# Patient Record
Sex: Male | Born: 1970 | Hispanic: Yes | Marital: Single | Smoking: Never smoker
Health system: Southern US, Community
[De-identification: ages and names within clinical notes are randomized; demographics above are authoritative.]

---

## 2011-11-16 ENCOUNTER — Other Ambulatory Visit: Payer: Self-pay | Admitting: Geriatric Medicine

## 2011-11-16 ENCOUNTER — Ambulatory Visit
Admission: RE | Admit: 2011-11-16 | Discharge: 2011-11-16 | Disposition: A | Discharge status: Home / Self Care | Payer: No Typology Code available for payment source | Source: Ambulatory Visit | Attending: Geriatric Medicine | Admitting: Geriatric Medicine

## 2011-11-16 DIAGNOSIS — D499 Neoplasm of unspecified behavior of unspecified site: Secondary | ICD-10-CM

## 2011-11-16 DIAGNOSIS — Q899 Congenital malformation, unspecified: Secondary | ICD-10-CM

## 2011-11-22 ENCOUNTER — Other Ambulatory Visit: Payer: Self-pay

## 2011-12-10 ENCOUNTER — Ambulatory Visit (INDEPENDENT_AMBULATORY_CARE_PROVIDER_SITE_OTHER): Payer: Self-pay | Admitting: General Surgery

## 2011-12-14 ENCOUNTER — Encounter (INDEPENDENT_AMBULATORY_CARE_PROVIDER_SITE_OTHER): Payer: Self-pay | Admitting: General Surgery

## 2011-12-14 ENCOUNTER — Ambulatory Visit (INDEPENDENT_AMBULATORY_CARE_PROVIDER_SITE_OTHER): Payer: Self-pay | Admitting: General Surgery

## 2011-12-14 VITALS — BP 140/92 | HR 72 | Temp 99.4°F | Resp 20 | Ht 61.0 in | Wt 140.4 lb

## 2011-12-14 DIAGNOSIS — K429 Umbilical hernia without obstruction or gangrene: Secondary | ICD-10-CM

## 2011-12-14 NOTE — Progress Notes (Signed)
Chief Complaint  Patient presents with  . Pre-op Exam    eval UMB hernia    HISTORY: This is a 41yo M who has noticed an umbilical mass for about 3 yrs now.  It does not cause him pain and has not gotten bigger.  He denies any signs of bowel incarceration either.  He does have a job that involves quite a bit of lifting, and he would like to be evaluated for surgery.    History reviewed. No pertinent past medical history.  History reviewed. No pertinent past surgical history.  No current outpatient prescriptions on file.  No Known Allergies   Family History  Problem Relation Age of Onset  . Cancer Father     prostate    History   Social History  . Marital Status: Single    Spouse Name: N/A    Number of Children: N/A  . Years of Education: N/A   Social History Main Topics  . Smoking status: Never Smoker   . Smokeless tobacco: Never Used  . Alcohol Use: No  . Drug Use: No  . Sexually Active: None   Other Topics Concern  . None   Social History Narrative  . None     REVIEW OF SYSTEMS - PERTINENT POSITIVES ONLY: 12 point review of systems negative other than HPI and PMH  EXAM: Filed Vitals:   12/14/11 1110  BP: 140/92  Pulse: 72  Temp: 99.4 F (37.4 C)  Resp: 20    Gen:  No acute distress.  Well nourished and well groomed.   Neurological: Alert and oriented to person, place, and time. Coordination normal.  Head: Normocephalic and atraumatic.  Eyes: Conjunctivae are normal. Pupils are equal, round, and reactive to light. No scleral icterus.  Neck: Normal range of motion. Neck supple.  Cardiovascular: Normal rate, regular rhythm Respiratory: Effort normal.   GI: Soft. The abdomen is soft and there is an incarcerated umbilical hernia that is mildly tender to palpation.  There is no rebound and no guarding.  Musculoskeletal: Normal range of motion. Extremities are nontender.  Skin: Skin is warm and dry. No rash noted. No diaphoresis. No erythema. No pallor.  No clubbing, cyanosis, or edema.   Psychiatric: Normal mood and affect. Behavior is normal. Judgment and thought content normal.    RADIOLOGY RESULTS:   Images and reports are reviewed. US Abdomen 11/16/11 IMPRESSION:  1. In the region of the palpable umbilical abnormality there is a  2.6 x 1.1 x 2.6 cm region of heterogeneous echotexture which  appears to contain internal peristalsis, suggestive of an umbilical  hernia.  2. Hepatic steatosis.  3. The pancreas was not visualized secondary to overlying bowel  gas.   ASSESSMENT AND PLAN: Umbilical Hernia- relatively asymptomatic  We discussed his diagnosis.  I told him that given his lack of symptoms, this did not need to be repaired right away, but that I did recommend eventually having it repaired while it was still small.  Given his job, it would most likely enlarge over time.  We discussed the risks of bowel incarceration and the symptoms that he should watch out for in the future.  He would like to wait until Nov to have surgery, so that he can save up some money.  He will RTC in Oct for scheduling and re-evaluation.     Vanita Panda, MD Colon and Rectal Surgery / General Surgery [facility]      Visit Diagnoses: 1. Umbilical hernia  Primary Care Physician: Quentin Mulling, MD

## 2011-12-14 NOTE — Patient Instructions (Signed)
Schedule a follow up apt for ~4 wks before you'd like to have surgery.  Call the office if you develop increasing pain or unresolving nausea or vomiting

## 2012-02-17 ENCOUNTER — Encounter (INDEPENDENT_AMBULATORY_CARE_PROVIDER_SITE_OTHER): Payer: Self-pay | Admitting: General Surgery

## 2012-02-29 ENCOUNTER — Encounter (INDEPENDENT_AMBULATORY_CARE_PROVIDER_SITE_OTHER): Payer: Self-pay | Admitting: General Surgery

## 2013-06-05 IMAGING — US US ABDOMEN COMPLETE
1 series · 13 of 25 positions shown · non-contrast
Comparison: No priors.

CLINICAL DATA: 3 cm tumor in the umbilical region.

COMPLETE ABDOMINAL ULTRASOUND

[Series 1: us abdomen complete · 0.41mm/px · 81 acquisitions, 13 frames shown]
[im 1/81]
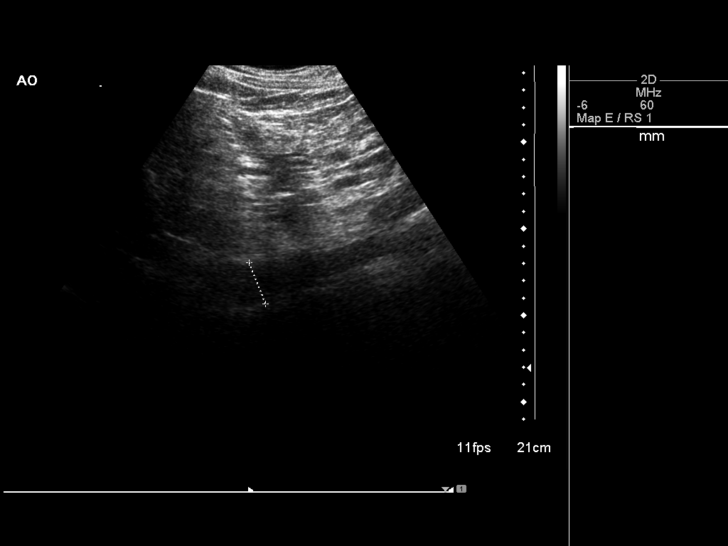
[im 7/81]
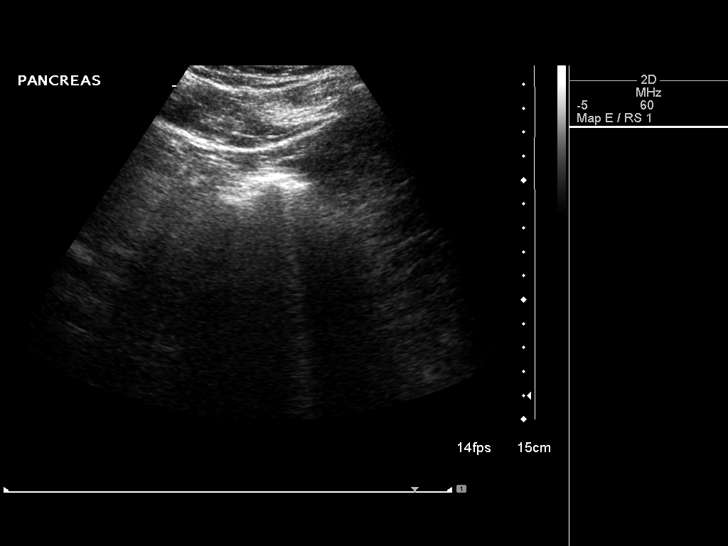
[im 14/81]
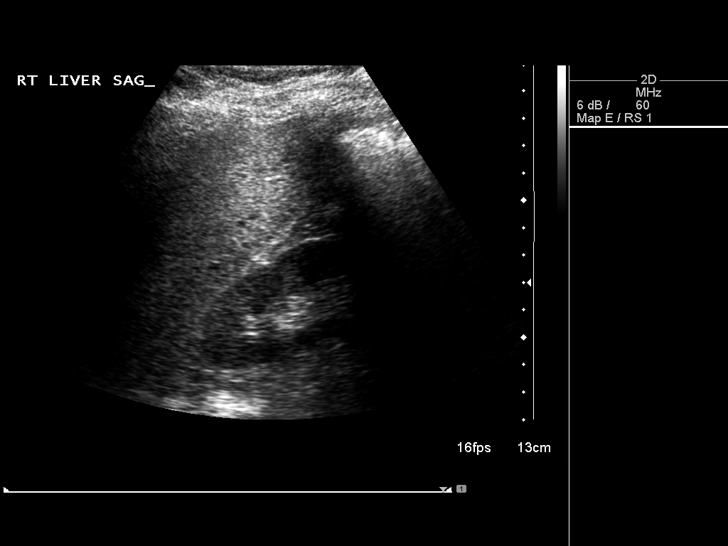
[im 21/81]
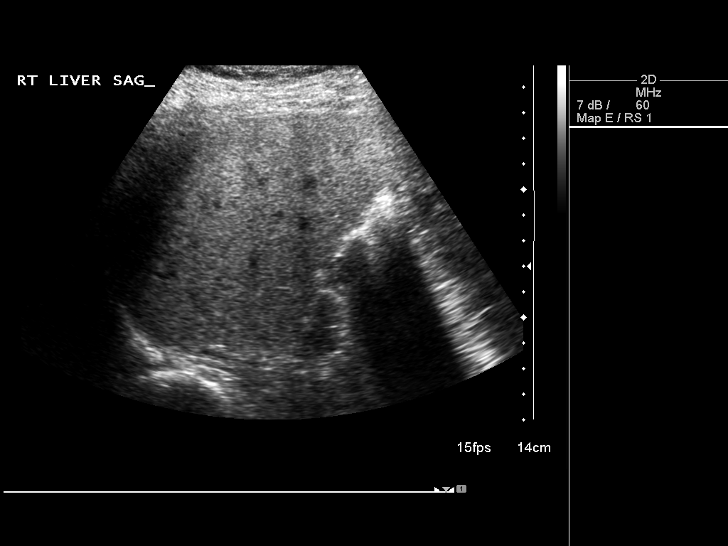
[im 27/81]
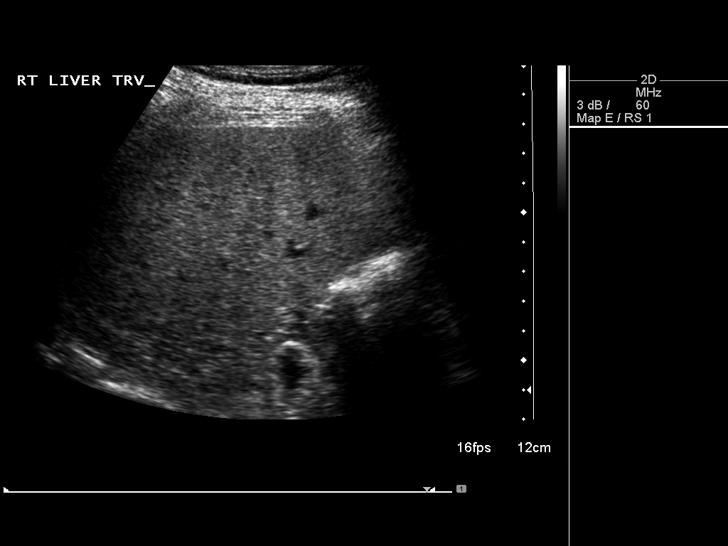
[im 34/81]
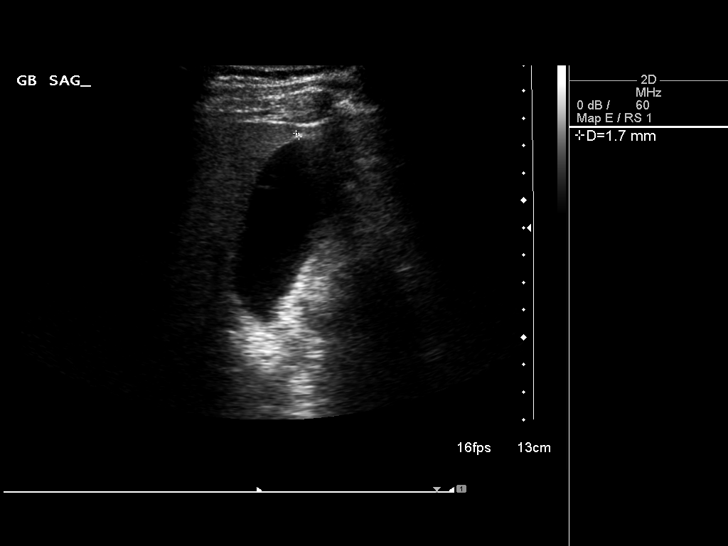
[im 41/81]
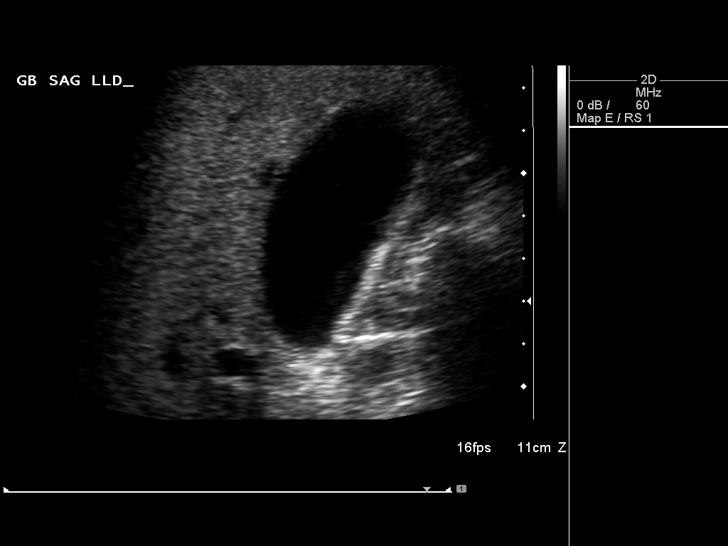
[im 47/81]
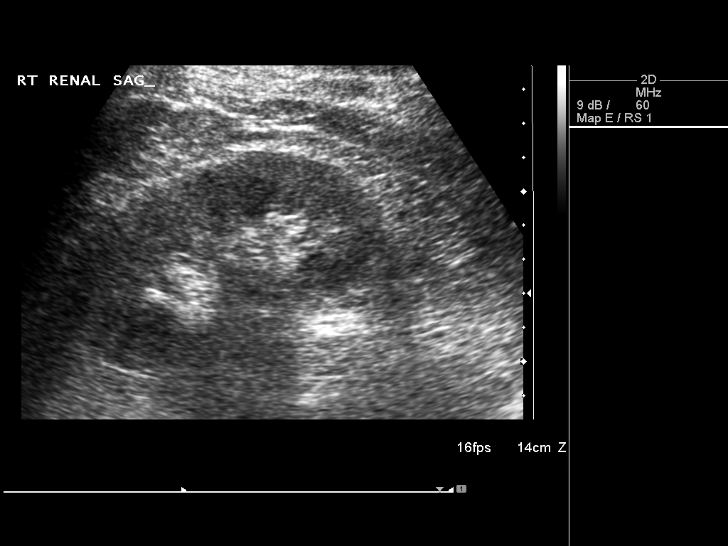
[im 54/81]
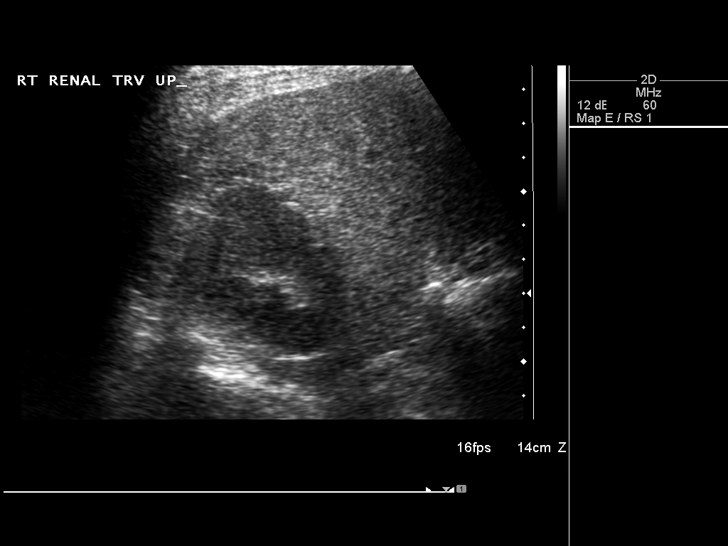
[im 61/81]
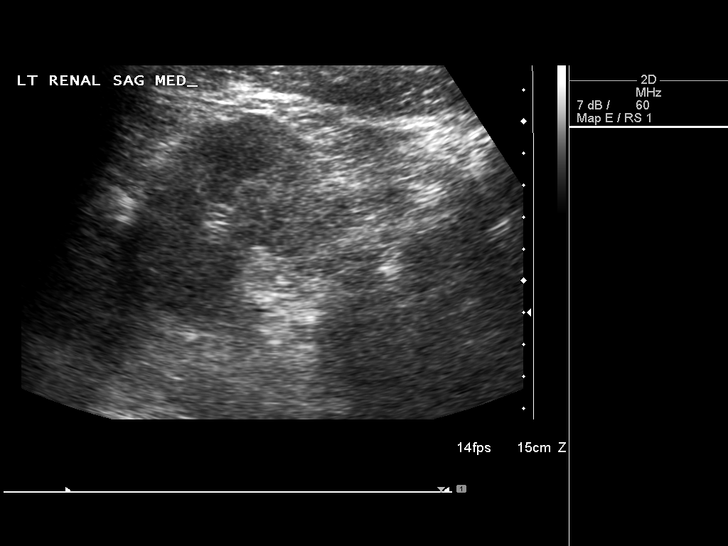
[im 67/81]
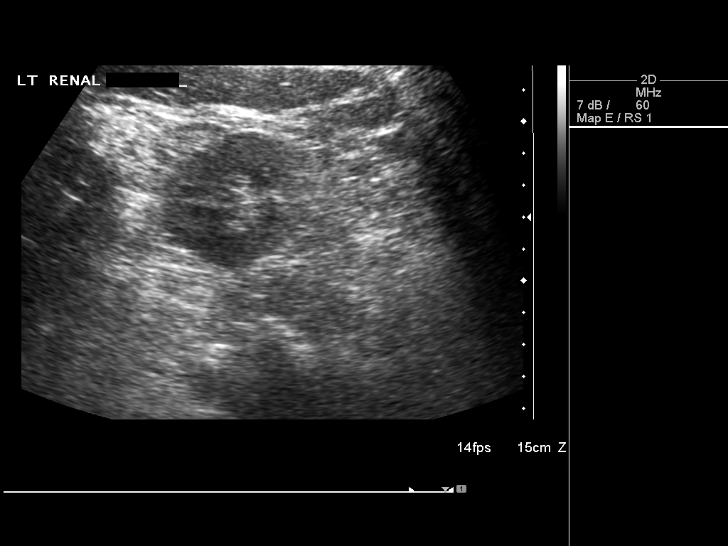
[im 74/81]
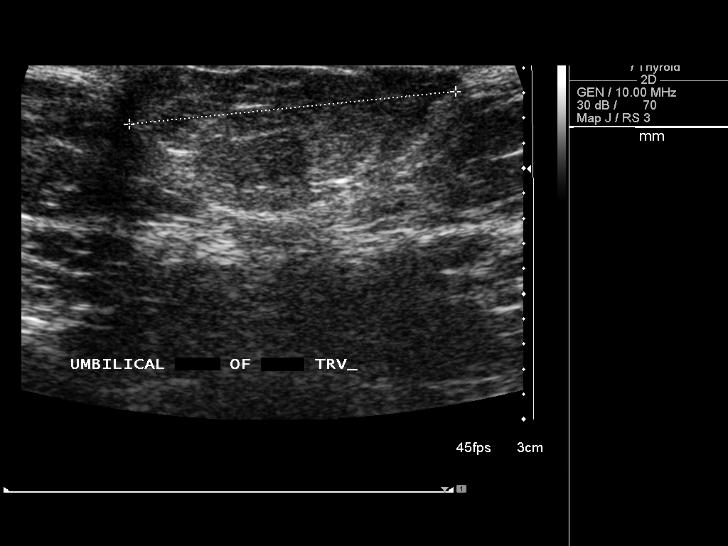
[im 81/81]
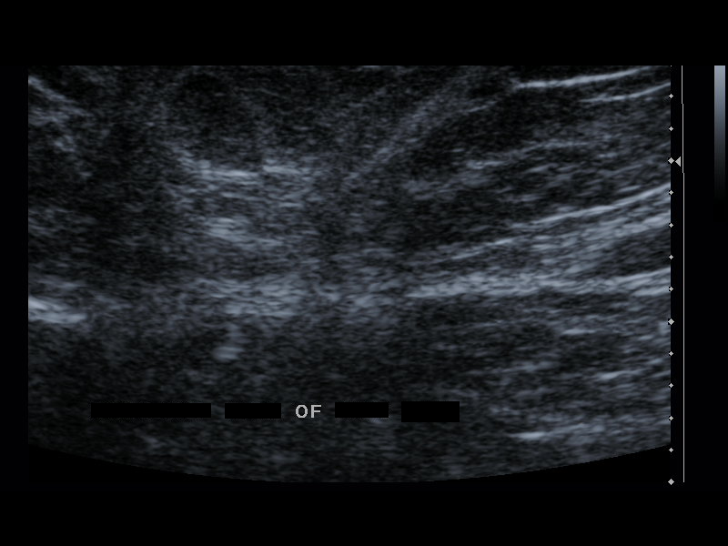

[13 of 25 positions shown; findings below may reference images not displayed]

FINDINGS: Gallbladder:  No shadowing gallstones or echogenic sludge.  No
gallbladder wall thickening or pericholecystic fluid.  Negative
sonographic Murphy's sign according to the ultrasound technologist.

Common bile duct:  Normal caliber measuring 3 mm in the porta
hepatis.

Liver:  Diffusely echogenic, compatible with hepatic steatosis.  No
focal cystic or solid hepatic lesions.  No evidence of intrahepatic
biliary ductal dilatation.  Normal hepatopetal flow in the portal
vein.

IVC:  Patent throughout its visualized course in the abdomen.

Pancreas:  Completely obscured by overlying bowel gas.

Spleen:  Normal size and echotexture without focal parenchymal
abnormality. 4.6 cm in length

Right Kidney:  No hydronephrosis.  Well-preserved cortex.  Normal
size and parenchymal echotexture without focal abnormalities.
cm in length.

Left Kidney:  No hydronephrosis.  Well-preserved cortex.  Normal
size and parenchymal echotexture without focal abnormalities.
cm in length.

Abdominal aorta:  Normal caliber measuring 2.6 cm proximally and
tapering appropriately distally.

Additional findings:  Additional imaging of the umbilicus and
periumbilical region demonstrated a 2.6 x 1.1 x 2.6 cm area of
heterogeneous echotexture which appeared to contain some internal
peristalsis, likely represent an umbilical hernia.
IMPRESSION: 1.  In the region of the palpable umbilical abnormality there is a
2.6 x 1.1 x 2.6 cm region of heterogeneous echotexture which
appears to contain internal peristalsis, suggestive of an umbilical
hernia.
2.  Hepatic steatosis.
3.  The pancreas was not visualized secondary to overlying bowel
gas.

## 2017-11-08 ENCOUNTER — Ambulatory Visit: Payer: Self-pay | Admitting: Urgent Care

## 2017-11-08 ENCOUNTER — Other Ambulatory Visit: Payer: Self-pay

## 2017-11-08 ENCOUNTER — Emergency Department (HOSPITAL_COMMUNITY)
Admission: EM | Admit: 2017-11-08 | Discharge: 2017-11-08 | Disposition: A | Discharge status: Home / Self Care | Payer: Self-pay | Attending: Emergency Medicine | Admitting: Emergency Medicine

## 2017-11-08 ENCOUNTER — Encounter (HOSPITAL_COMMUNITY): Payer: Self-pay | Admitting: Emergency Medicine

## 2017-11-08 DIAGNOSIS — Y939 Activity, unspecified: Secondary | ICD-10-CM | POA: Insufficient documentation

## 2017-11-08 DIAGNOSIS — Z23 Encounter for immunization: Secondary | ICD-10-CM | POA: Insufficient documentation

## 2017-11-08 DIAGNOSIS — Y929 Unspecified place or not applicable: Secondary | ICD-10-CM | POA: Insufficient documentation

## 2017-11-08 DIAGNOSIS — S61412A Laceration without foreign body of left hand, initial encounter: Secondary | ICD-10-CM | POA: Insufficient documentation

## 2017-11-08 DIAGNOSIS — Y990 Civilian activity done for income or pay: Secondary | ICD-10-CM | POA: Insufficient documentation

## 2017-11-08 DIAGNOSIS — W260XXA Contact with knife, initial encounter: Secondary | ICD-10-CM | POA: Insufficient documentation

## 2017-11-08 MED ORDER — LIDOCAINE HCL (PF) 1 % IJ SOLN
30.0000 mL | Freq: Once | INTRAMUSCULAR | Status: AC
  Administered 2017-11-08: 30 mL
  Filled 2017-11-08: qty 30

## 2017-11-08 MED ORDER — TETANUS-DIPHTH-ACELL PERTUSSIS 5-2.5-18.5 LF-MCG/0.5 IM SUSP
0.5000 mL | Freq: Once | INTRAMUSCULAR | Status: AC
  Administered 2017-11-08: 0.5 mL via INTRAMUSCULAR
  Filled 2017-11-08: qty 0.5

## 2017-11-08 NOTE — ED Triage Notes (Signed)
Pt with laceration to the left hand. Bleeding controlled at triage. Wound irrigated and cleaned.

## 2017-11-08 NOTE — Discharge Instructions (Signed)
Tylenol or Ibuprofen for pain.  See your primary care in 5-7 days to get stitches removed.

## 2017-11-08 NOTE — ED Provider Notes (Signed)
[facility] EM[facility] DEPARTMENT Provider Note  CSN: [id number] Arrival date & time: 11/08/17  1417  History   Chief Complaint Chief Complaint  Patient presents with  . Extremity Laceration    HPI  A medical interpreter was used for this entire encounter.  Tom Chapman is a 47 y.o. male with no significant medical history who presented to the ED for hand laceration. Patient states he cut his left palm with a knife while at work today. He denies color or temperature changes. Denies paresthesias, change in ROM or weakness. He is not on an anticoagulant.  History reviewed. No pertinent past medical history.  There are no active problems to display for this patient.   History reviewed. No pertinent surgical history.      Home Medications    Prior to Admission medications   Not on File    Family History No family history on file.  Social History Social History   Tobacco Use  . Smoking status: Never Smoker  . Smokeless tobacco: Never Used  Substance Use Topics  . Alcohol use: Not Currently  . Drug use: Never     Allergies   Patient has no known allergies.   Review of Systems Review of Systems  Constitutional: Negative.   Musculoskeletal: Negative.   Skin: Positive for wound. Negative for color change and pallor.  Neurological: Negative for weakness and numbness.  Hematological: Does not bruise/bleed easily.     Physical Exam Updated Vital Signs BP (!) 130/92 (BP Location: Right Arm)   Pulse (!) 115   Temp 99.5 F (37.5 C) (Oral)   Resp 18   Ht 5\' 5"  (1.651 m)   Wt 54.4 kg (120 lb)   SpO2 98%   BMI 19.97 kg/m   Physical Exam  Constitutional: He appears well-developed and well-nourished.  Cardiovascular:  Pulses:      Radial pulses are 2+ on the right side, and 2+ on the left side.  Musculoskeletal:       Left hand: He exhibits laceration. He exhibits normal range of motion, no tenderness and normal capillary refill.  Normal sensation noted. Normal strength noted.       Hands: ~5cm laceration along the palmar aspect of the left 5th finger across the PIP. 5/5 grip strength and finger flexion/extension and opposition in hands bilaterally.  Neurological: He has normal strength and normal reflexes. No sensory deficit. He exhibits normal muscle tone.  Skin: Skin is warm. Capillary refill takes less than 2 seconds. Laceration noted.  Nursing note and vitals reviewed.    ED Treatments / Results  Labs (all labs ordered are listed, but only abnormal results are displayed) Labs Reviewed - No data to display  EKG None  Radiology No results found.  Procedures .Marland KitchenLaceration Repair Date/Time: 11/08/2017 4:01 PM Performed by: Romie Jumper, PA-C Authorized by: Romie Jumper, PA-C   Consent:    Consent obtained:  Verbal   Consent given by:  Patient   Risks discussed:  Infection, pain, retained foreign body, poor cosmetic result, poor wound healing and need for additional repair   Alternatives discussed:  No treatment Anesthesia (see MAR for exact dosages):    Anesthesia method:  Local infiltration   Local anesthetic:  Lidocaine 1% w/o epi Laceration details:    Location:  Hand   Hand location:  L palm   Length (cm):  5   Depth (mm):  3 Pre-procedure details:    Preparation:  Patient was prepped and draped  in usual sterile fashion Exploration:    Hemostasis achieved with:  Direct pressure   Wound exploration: wound explored through full range of motion and entire depth of wound probed and visualized     Wound extent: no nerve damage noted, no tendon damage noted and no vascular damage noted     Contaminated: no   Treatment:    Area cleansed with:  Betadine   Amount of cleaning:  Standard   Irrigation solution:  Sterile saline   Irrigation volume:  30cc   Irrigation method:  Syringe Skin repair:    Repair method:  Sutures   Suture size:  4-0   Suture material:  Prolene   Number of  sutures:  5 Approximation:    Approximation:  Close Post-procedure details:    Dressing:  Sterile dressing   Patient tolerance of procedure:  Tolerated well, no immediate complications   (including critical care time)  Medications Ordered in ED Medications  Tdap (BOOSTRIX) injection 0.5 mL (0.5 mLs Intramuscular Given 11/08/17 1513)  lidocaine (PF) (XYLOCAINE) 1 % injection 30 mL (30 mLs Infiltration Given 11/08/17 1513)     Initial Impression / Assessment and Plan / ED Course  Triage vital signs and the nursing notes have been reviewed.  Pertinent labs & imaging results that were available during care of the patient were reviewed and considered in medical decision making (see chart for details).   Patient presents with a left palmar laceration. Neurovascular function is intact. Patient's strength and ROM of the affected hand are normal. Bleeding controlled prior to ED arrival. Laceration repaired with 5 sutures. Tdap booster given. Education provided on appropriate wound care and follow-up.  Final Clinical Impressions(s) / ED Diagnoses  1. Left Hand Laceration. 5 sutures applied today. Education provided on appropriate wound care and follow-up for suture removal. Education provided on s/s of infection that would warrant return to the ED.  Dispo: Home. After thorough clinical evaluation, this patient is determined to be medically stable and can be safely discharged with the previously mentioned treatment and/or outpatient follow-up/referral(s). At this time, there are no other apparent medical conditions that require further screening, evaluation or treatment.   Final diagnoses:  Laceration of left hand without foreign body, initial encounter    ED Discharge Orders    None        Junita Push 11/08/17 [place], MD 11/10/17 [phone]

## 2017-11-09 ENCOUNTER — Encounter (INDEPENDENT_AMBULATORY_CARE_PROVIDER_SITE_OTHER): Payer: Self-pay | Admitting: General Surgery

## 2019-01-12 ENCOUNTER — Telehealth: Payer: Self-pay | Admitting: Hematology

## 2019-01-12 NOTE — Telephone Encounter (Signed)
Received a new hem referral from Dr. Manual Meier for lymphocytosis. A new hem appt has been scheduled for Tom Chapman to see Dr. Irene Limbo on 10/7 at 1pm. Letter mailed.

## 2019-01-15 ENCOUNTER — Encounter: Payer: Self-pay | Admitting: Hematology

## 2019-01-26 NOTE — Progress Notes (Signed)
HEMATOLOGY/ONCOLOGY CONSULTATION NOTE  Date of Service: 01/31/2019  Patient Care Team: Garnette Czech, MD as PCP - General (Geriatric Medicine)  CHIEF COMPLAINTS/PURPOSE OF CONSULTATION:  Lymphocytosis  HISTORY OF PRESENTING ILLNESS:   Tom Chapman is a wonderful 48 y.o. male who has been referred to [place] by Dr Manual Meier for evaluation and management of lymphocytosis. Pt is accompanied today by a friend who is translating. The pt reports that he is doing well overall.  The pt reports that he feels fine and has had no concerns. There has been nothing that has made him feel differently in the last 6 months. Pt denies unexpected weight loss, fevers, chills, night sweats. His most recent labs were done for a physical. He notes that he had a runny nose due to allergies around the time of his last labs. Pt takes nasal steroids to help with his allergies. He takes them at night and does not feel that it is helpful. Pt was diagnosed with pre-Diabetes and is currently taking Metformin. Pt was taking Atorvastatin for his dyslipidemia but is no longer taking it. He does not have HTN. Pt is still taking the OTC Vitamin B12 and Omega 3 supplements.   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 There are certain fumes that bother his nose. He has recently began wearing a mask due to [place]. Pt denies getting anxious with blood draws.   Most recent lab results (12/29/2018) of CBC w/diff & CMP is as follows: WBC at 7.6K, RBC at 5.25, Hgb at 15.3, HCT at 46.5, MCV at 89, MCH at 29.1, MCHC at 32.9, RDW at 13.3, PLTs at 260K, Neutro Abs at 2.5K, Lymphs Abs 4.4K, Monocytes 0.5K, Eos Abs at 0.2K, Baso at 0.0K, Abs Immature Granulocytes at 0.0K, Glucose at 111, BUN at 13, Creatinine at 0.90, GFR Est Non Af Am at 99, BUN/Creatinine at 14, Sodium at 143, Potassium at 4.7, Chloride at 107, CO2 at 24, Calcium at 9.1, Total Protein at 7.1, Albumin at 4.7, Total  Globulin at 2.4, A/G Ratio at 2.0, Total Bilirubin at 0.4, Alkaline Phosphatase at 82, AST at 30, ALT at 38. 09/06/2018 Hemopath Smear revealed "Lymphocytosis with reactive changes, suggestive of a viral process. Observations suggest a reactive of inflammatory process"  09/24/2018 C-reactive protein at <1 09/24/2018 Sed rate at 2  On review of systems, pt denies unexpected weight loss, fevers, chills, night sweats, joint pain, skin rashes, bowel movement changes, leg swelling, back pain, dysuria, abdominal pain and any other symptoms.   On PMHx the pt reports Prediabetes, Dyslipidemia. On Social Hx the pt reports that he is a non-smoker, no ETOH consumption   MEDICAL HISTORY:  No past medical history on file.  Onychomycosis of toenails Type 2 DM  Dyslipidemia Lymphocytosis  Stress Nasal congestion  Umbilical hernia  Bereavement   SURGICAL HISTORY: No past surgical history on file. Ingrown toenail procedure of R great toe laterally   SOCIAL HISTORY: Social History   Socioeconomic History   Marital status: Single    Spouse name: Not on file   Number of children: Not on file   Years of education: Not on file   Highest education level: Not on file  Occupational History   Not on file  Social Needs   Financial resource strain: Not on file   Food insecurity    Worry: Not on file    Inability: Not on file   Transportation needs  Medical: Not on file    Non-medical: Not on file  Tobacco Use   Smoking status: Never Smoker   Smokeless tobacco: Never Used  Substance and Sexual Activity   Alcohol use: Not Currently   Drug use: Never   Sexual activity: Not on file  Lifestyle   Physical activity    Days per week: Not on file    Minutes per session: Not on file   Stress: Not on file  Relationships   Social connections    Talks on phone: Not on file    Gets together: Not on file    Attends religious service: Not on file    Active member of club or  organization: Not on file    Attends meetings of clubs or organizations: Not on file    Relationship status: Not on file   Intimate partner violence    Fear of current or ex partner: Not on file    Emotionally abused: Not on file    Physically abused: Not on file    Forced sexual activity: Not on file  Other Topics Concern   Not on file  Social History Narrative   ** Merged History Encounter **        FAMILY HISTORY: Family History  Problem Relation Age of Onset   Cancer Father        prostate  Mother - Neoplasm of liver  Sister - DM    ALLERGIES:  has No Known Allergies.  MEDICATIONS:  No current outpatient medications on file.   No current facility-administered medications for this visit.   Atorvastatin 20 mg Mometasone 50 mcg/acutation nasal spray  Metformin ER 500 mg  Omega 3 Vitamin B12  REVIEW OF SYSTEMS:    10 Point review of Systems was done is negative except as noted above.  PHYSICAL EXAMINATION: ECOG PERFORMANCE STATUS: 0 - Asymptomatic  . Vitals:   01/31/19 1313  BP: (!) 152/94  Pulse: 87  Resp: 18  Temp: 99.2 F (37.3 C)  SpO2: 100%   Filed Weights   01/31/19 1313  Weight: 131 lb 8 oz (59.6 kg)   .Body mass index is 24.85 kg/m.  GENERAL:alert, in no acute distress and comfortable SKIN: no acute rashes, no significant lesions EYES: conjunctiva are pink and non-injected, sclera anicteric OROPHARYNX: MMM, no exudates, no oropharyngeal erythema or ulceration NECK: supple, no JVD LYMPH:  no palpable lymphadenopathy in the cervical, axillary or inguinal regions LUNGS: clear to auscultation b/l with normal respiratory effort HEART: regular rate & rhythm ABDOMEN:  normoactive bowel sounds , non tender, not distended. Extremity: no pedal edema PSYCH: alert & oriented x 3 with fluent speech NEURO: no focal motor/sensory deficits  LABORATORY DATA:  I have reviewed the data as listed  . CBC Latest Ref Rng & Units 01/31/2019  WBC 4.0 -  10.5 K/uL 6.0  Hemoglobin 13.0 - 17.0 g/dL 15.8  Hematocrit 39.0 - 52.0 % 47.7  Platelets 150 - 400 K/uL 268  . CBC    Component Value Date/Time   WBC 6.0 01/31/2019 1422   RBC 5.45 01/31/2019 1422   HGB 15.8 01/31/2019 1422   HCT 47.7 01/31/2019 1422   PLT 268 01/31/2019 1422   MCV 87.5 01/31/2019 1422   MCH 29.0 01/31/2019 1422   MCHC 33.1 01/31/2019 1422   RDW 13.2 01/31/2019 1422   LYMPHSABS 2.5 01/31/2019 1422   MONOABS 0.4 01/31/2019 1422   EOSABS 0.1 01/31/2019 1422   BASOSABS 0.0 01/31/2019 1422     .  CMP Latest Ref Rng & Units 01/31/2019  Glucose 70 - 99 mg/dL 107(H)  BUN 6 - 20 mg/dL 14  Creatinine 0.61 - 1.24 mg/dL 1.01  Sodium 135 - 145 mmol/L 140  Potassium 3.5 - 5.1 mmol/L 4.3  Chloride 98 - 111 mmol/L 103  CO2 22 - 32 mmol/L 28  Calcium 8.9 - 10.3 mg/dL 9.5  Total Protein 6.5 - 8.1 g/dL 8.1  Total Bilirubin 0.3 - 1.2 mg/dL 0.4  Alkaline Phos 38 - 126 U/L 93  AST 15 - 41 U/L 24  ALT 0 - 44 U/L 31   . Lab Results  Component Value Date   LDH 163 01/31/2019   Sed rate 0  Surgical Pathology  CASE: [id number]  PATIENT: Tod CABELLO-CASTRO  Flow Pathology Report      Clinical history: None provided      DIAGNOSIS:   - No monoclonal B-cell or phenotypically aberrant T-cell population  identified.   RADIOGRAPHIC STUDIES: I have personally reviewed the radiological images as listed and agreed with the findings in the report. No results found.  ASSESSMENT & PLAN:   48 yo with   1) Lymphocytosis Lymphocyte count 4.4k. No constituional symptoms. PLAN: -Discussed patient's most recent labs from 12/29/2018, WBC at 7.6K, RBC at 5.25, Hgb at 15.3, HCT at 46.5, MCV at 89, MCH at 29.1, MCHC at 32.9, RDW at 13.3, PLTs at 260K, Neutro Abs at 2.5K, Lymphs Abs 4.4K, Monocytes 0.5K, Eos Abs at 0.2K, Baso at 0.0K, Abs Immature Granulocytes at 0.0K, Glucose at 111, BUN at 13, Creatinine at 0.90, GFR Est Non Af Am at 99, BUN/Creatinine at 14, Sodium  at 143, Potassium at 4.7, Chloride at 107, CO2 at 24, Calcium at 9.1, Total Protein at 7.1, Albumin at 4.7, Total Globulin at 2.4, A/G Ratio at 2.0, Total Bilirubin at 0.4, Alkaline Phosphatase at 82, AST at 30, ALT at 38. -Discussed 09/06/2018 Hemopath Smear revealed "Lymphocytosis with reactive changes, suggestive of a viral process. Observations suggest a reactive of inflammatory process"  -Discussed 09/24/2018 C-reactive protein at <1 -Discussed 09/24/2018 Sed rate at 2 -Discussed that chronic lymphocytosis could be due to an ongoing reactive process - most likely allergies  -Discussed that lymphocytosis being due to a primary bone problem is not as likely because of his age. Rpt labs today show resolution of his lymphocytosis. Flow cytometry with no clonal lymphocyte population LDH and sed rate WNL -no indication for additional hematologic workup at this time.  FOLLOW UP: Labs today RTC with Dr Irene Limbo as needed  All of the patients questions were answered with apparent satisfaction. The patient knows to call the clinic with any problems, questions or concerns.  I spent 20 mins counseling the patient face to face. The total time spent in the appointment was 30 mins and more than 50% was on counseling and direct patient cares.    Sullivan Lone MD [place] AAHIVMS [facility] Hematology/Oncology Physician [facility]  (Office):       [phone] (Work cell):  [phone] (Fax):           [phone]  01/31/2019 2:35 PM  I, Yevette Edwards, am acting as a scribe for Dr. Sullivan Lone.   .I have reviewed the above documentation for accuracy and completeness, and I agree with the above. Brunetta Genera MD

## 2019-01-31 ENCOUNTER — Inpatient Hospital Stay: Payer: Self-pay | Attending: Hematology | Admitting: Hematology

## 2019-01-31 ENCOUNTER — Inpatient Hospital Stay: Payer: Self-pay

## 2019-01-31 ENCOUNTER — Telehealth: Payer: Self-pay | Admitting: Hematology

## 2019-01-31 ENCOUNTER — Other Ambulatory Visit: Payer: Self-pay

## 2019-01-31 VITALS — BP 152/94 | HR 87 | Temp 99.2°F | Resp 18 | Ht 61.0 in | Wt 131.5 lb

## 2019-01-31 DIAGNOSIS — D72820 Lymphocytosis (symptomatic): Secondary | ICD-10-CM | POA: Insufficient documentation

## 2019-01-31 DIAGNOSIS — Z7984 Long term (current) use of oral hypoglycemic drugs: Secondary | ICD-10-CM | POA: Insufficient documentation

## 2019-01-31 DIAGNOSIS — Z79899 Other long term (current) drug therapy: Secondary | ICD-10-CM | POA: Insufficient documentation

## 2019-01-31 DIAGNOSIS — E119 Type 2 diabetes mellitus without complications: Secondary | ICD-10-CM | POA: Insufficient documentation

## 2019-01-31 DIAGNOSIS — E785 Hyperlipidemia, unspecified: Secondary | ICD-10-CM | POA: Insufficient documentation

## 2019-01-31 LAB — CBC WITH DIFFERENTIAL/PLATELET
Abs Immature Granulocytes: 0.01 K/uL (ref 0.00–0.07)
Basophils Absolute: 0 K/uL (ref 0.0–0.1)
Basophils Relative: 1 %
Eosinophils Absolute: 0.1 K/uL (ref 0.0–0.5)
Eosinophils Relative: 1 %
HCT: 47.7 % (ref 39.0–52.0)
Hemoglobin: 15.8 g/dL (ref 13.0–17.0)
Immature Granulocytes: 0 %
Lymphocytes Relative: 42 %
Lymphs Abs: 2.5 K/uL (ref 0.7–4.0)
MCH: 29 pg (ref 26.0–34.0)
MCHC: 33.1 g/dL (ref 30.0–36.0)
MCV: 87.5 fL (ref 80.0–100.0)
Monocytes Absolute: 0.4 K/uL (ref 0.1–1.0)
Monocytes Relative: 6 %
Neutro Abs: 3 K/uL (ref 1.7–7.7)
Neutrophils Relative %: 50 %
Platelets: 268 K/uL (ref 150–400)
RBC: 5.45 MIL/uL (ref 4.22–5.81)
RDW: 13.2 % (ref 11.5–15.5)
WBC: 6 K/uL (ref 4.0–10.5)
nRBC: 0 % (ref 0.0–0.2)

## 2019-01-31 LAB — CMP (CANCER CENTER ONLY)
ALT: 31 U/L (ref 0–44)
AST: 24 U/L (ref 15–41)
Albumin: 4.6 g/dL (ref 3.5–5.0)
Alkaline Phosphatase: 93 U/L (ref 38–126)
Anion gap: 9 (ref 5–15)
BUN: 14 mg/dL (ref 6–20)
CO2: 28 mmol/L (ref 22–32)
Calcium: 9.5 mg/dL (ref 8.9–10.3)
Chloride: 103 mmol/L (ref 98–111)
Creatinine: 1.01 mg/dL (ref 0.61–1.24)
GFR, Est AFR Am: >60 mL/min (ref >60)
GFR, Estimated: >60 mL/min (ref >60)
Glucose, Bld: 107 mg/dL — ABNORMAL HIGH (ref 70–99)
Potassium: 4.3 mmol/L (ref 3.5–5.1)
Sodium: 140 mmol/L (ref 135–145)
Total Bilirubin: 0.4 mg/dL (ref 0.3–1.2)
Total Protein: 8.1 g/dL (ref 6.5–8.1)

## 2019-01-31 LAB — SEDIMENTATION RATE: Sed Rate: 0 mm/hr (ref 0–16)

## 2019-01-31 LAB — LACTATE DEHYDROGENASE: LDH: 163 U/L (ref 98–192)

## 2019-01-31 NOTE — Patient Instructions (Signed)
Thank you for choosing [facility] to provide your oncology and hematology care.   Should you have questions after your visit to the [facility] ([facility]), please contact this office at [phone] between 8:30 AM and 4:30 PM. Voicemails left after 4:00 PM may not be returned until the following business day. Calls received after 4:30 PM will be answered by an off-site Nurse Triage Line.    Prescription Refills:  Please have your pharmacy contact us directly for most prescription requests.  Contact the office directly for refills of narcotics (pain medications). Allow 48-72 hours for refills.  Appointments: Please contact the [facility] scheduling department [phone] for questions regarding [facility] appointment scheduling.  Contact the schedulers with any scheduling changes so that your appointment can be rescheduled in a timely manner.   Central Scheduling for [place] [phone] - Call to schedule procedures such as PET scans, CT scans, MRI, Ultrasound, etc.  To afford each patient quality time with our providers, please arrive 30 minutes before your scheduled appointment time.  If you arrive late for your appointment, you may be asked to reschedule.  We strive to give you quality time with our providers, and arriving late affects you and other patients whose appointments are after yours. If you are a no show for multiple scheduled visits, you may be dismissed from the clinic at the providers discretion.     Resources: [facility] Social Workers [phone] for additional information on assistance programs --Anne Cunningham/Abigail Elmore  [place] DSS  [phone]: Information regarding food stamps, Medicaid, and utility assistance [employer] [phone]   [place] [employer]'s shared-ride transportation service for eligible riders who have a disability that prevents them from riding the fixed route bus.   Medicare Rights Center 800-333-4114 Helps people with  Medicare understand their rights and benefits, navigate the Medicare system, and secure the quality healthcare they deserve [employer] 800-227-2345 Assists patients locate various types of support and financial assistance Cancer Care: 1-800-813-HOPE (4673) Provides financial assistance, online support groups, medication/co-pay assistance.      

## 2019-01-31 NOTE — Telephone Encounter (Signed)
Per 10/7 los RTC with Dr Irene Limbo as needed

## 2019-02-01 LAB — SURGICAL PATHOLOGY

## 2019-02-02 LAB — FLOW CYTOMETRY

## 2019-02-15 ENCOUNTER — Telehealth: Payer: Self-pay | Admitting: *Deleted

## 2019-02-15 NOTE — Telephone Encounter (Signed)
-----   Message from Rolland Bimler, RN sent at 02/15/2019 11:01 AM EDT ----- Have attempted to contact patient by phone for over a week without success - no answer and no voice mail set up. Dr. Irene Limbo said ok to send by mail.  ----- Message ----- From: Brunetta Genera, MD Sent: 02/06/2019  11:39 PM EDT To: Rolland Bimler, RN  Plz let patient know his blood counts have normalized and workup was no concerning for leukemia or lymphoma. Information forwarded to his PCP.

## 2019-02-15 NOTE — Telephone Encounter (Signed)
Notified of message below. Verbalized understanding 

## 2019-02-22 ENCOUNTER — Telehealth: Payer: Self-pay | Admitting: Hematology

## 2019-02-22 NOTE — Telephone Encounter (Signed)
Returned patient's phone call regarding rescheduling, patient would like some information regarding his test results. Informed patient I will be forwarding his message and that he should be contacted soon.

## 2019-03-15 ENCOUNTER — Telehealth: Payer: Self-pay | Admitting: *Deleted

## 2019-03-15 NOTE — Telephone Encounter (Signed)
Contacted patient using [name] [phone] Tom Chapman)  with instructions provided by Dr. Irene Limbo regarding test results after multiple attempts to contact patient:  Plz let patient know his blood counts have normalized and workup was no concerning for leukemia or lymphoma. Information forwarded to his PCP. Patient verbalized understanding.

## 2019-11-15 ENCOUNTER — Encounter: Payer: Self-pay | Admitting: Gastroenterology

## 2020-01-08 ENCOUNTER — Encounter: Payer: Self-pay | Admitting: Gastroenterology
